# Patient Record
Sex: Female | Born: 1980 | Race: White | Hispanic: No | Marital: Married | Smoking: Former smoker
Health system: Southern US, Community
[De-identification: ages and names within clinical notes are randomized; demographics above are authoritative.]

---

## 2019-11-14 ENCOUNTER — Encounter: Payer: Self-pay | Admitting: Family Medicine

## 2019-11-14 ENCOUNTER — Ambulatory Visit (INDEPENDENT_AMBULATORY_CARE_PROVIDER_SITE_OTHER): Payer: Managed Care, Other (non HMO) | Admitting: Family Medicine

## 2019-11-14 ENCOUNTER — Other Ambulatory Visit: Payer: Self-pay

## 2019-11-14 VITALS — BP 115/62 | HR 60 | Temp 97.9°F | Ht 64.0 in | Wt 166.6 lb

## 2019-11-14 DIAGNOSIS — J4520 Mild intermittent asthma, uncomplicated: Secondary | ICD-10-CM

## 2019-11-14 DIAGNOSIS — J309 Allergic rhinitis, unspecified: Secondary | ICD-10-CM

## 2019-11-14 DIAGNOSIS — Z0001 Encounter for general adult medical examination with abnormal findings: Secondary | ICD-10-CM

## 2019-11-14 DIAGNOSIS — Z131 Encounter for screening for diabetes mellitus: Secondary | ICD-10-CM

## 2019-11-14 DIAGNOSIS — Z7189 Other specified counseling: Secondary | ICD-10-CM

## 2019-11-14 DIAGNOSIS — Z7185 Encounter for immunization safety counseling: Secondary | ICD-10-CM

## 2019-11-14 DIAGNOSIS — Z0000 Encounter for general adult medical examination without abnormal findings: Secondary | ICD-10-CM

## 2019-11-14 DIAGNOSIS — Z13220 Encounter for screening for lipoid disorders: Secondary | ICD-10-CM

## 2019-11-14 MED ORDER — ALBUTEROL SULFATE HFA 108 (90 BASE) MCG/ACT IN AERS: 1.0000 | INHALATION_SPRAY | RESPIRATORY_TRACT | 0 refills | Status: DC | PRN

## 2019-11-14 NOTE — Progress Notes (Signed)
Subjective:  Patient ID: Tammie Taylor, female    DOB: 03-27-1981  Age: 39 y.o. MRN: [id number]  CC:  Chief Complaint  Patient presents with  . Asthma    hx of asthma and requesting resuce inhaler PRN and possible CPE for work     HPI [employer] presents for  New patient to me to establish care, discuss medications as well as physical. Moved from [place] 3 yrs ago. No PCP locally, has been doing well.  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   Asthma: Worse as child - less as she as gotten older. Triggered by allergies - pet dander, horse has been trigger. Albuterol only about once per year.  Generic zyrtec daily works well. Nasal spray in past without much benefit.  Surgical history: c section - 6yo dtr, 24 yo son.   Nonsmoker, rare alcohol - less than 1-2 per week.  Family history: Mother with possible fibromyalgia.  No FH of CA, or heart disease.   Cancer screening: Pap testing - 2 years ago. Normal - at obgyn - [place].    There is no immunization history on file for this patient. covid vaccine: has not had wanted to wait until it has been around.  No flu shots typically. Father had Raynald Blend with swine flu shot - no personal history.    No exam data present   History There are no problems to display for this patient.  History reviewed. No pertinent past medical history.  Allergies  Allergen Reactions  . Sulfa Antibiotics Hives    Other reaction(s): Fever   Prior to Admission medications   Not on File   Social History   Socioeconomic History  . Marital status: Married    Spouse name: Not on file  . Number of children: Not on file  . Years of education: Not on file  . Highest education level: Not on file  Occupational History  . Not on file  Tobacco Use  . Smoking status: Former Smoker    Quit date: 11/14/2018    Years since quitting: 1.0  . Smokeless tobacco: Never Used  Substance and  Sexual Activity  . Alcohol use: Yes    Comment: socailly   . Drug use: Never  . Sexual activity: Yes    Partners: Male  Other Topics Concern  . Not on file  Social History Narrative  . Not on file   Social Determinants of Health   Financial Resource Strain:   . Difficulty of Paying Living Expenses:   Food Insecurity:   . Worried About Charity fundraiser in the Last Year:   . Arboriculturist in the Last Year:   Transportation Needs:   . Film/video editor (Medical):   Marland Kitchen Lack of Transportation (Non-Medical):   Physical Activity:   . Days of Exercise per Week:   . Minutes of Exercise per Session:   Stress:   . Feeling of Stress :   Social Connections:   . Frequency of Communication with Friends and Family:   . Frequency of Social Gatherings with Friends and Family:   . Attends Religious Services:   . Active Member of Clubs or Organizations:   . Attends Archivist Meetings:   Marland Kitchen Marital Status:   Intimate Partner Violence:   . Fear of Current or Ex-Partner:   . Emotionally Abused:   Marland Kitchen Physically Abused:   . Sexually Abused:  Review of Systems Per HPI.   Objective:   Vitals:   11/14/19 1107  BP: (!) 115/62  Pulse: 60  Temp: 97.9 F (36.6 C)  TempSrc: Temporal  SpO2: 98%  Weight: 166 lb 9.6 oz (75.6 kg)  Height: '5\' 4"'  (1.626 m)     Physical Exam Constitutional:      Appearance: She is well-developed.  HENT:     Head: Normocephalic and atraumatic.     Right Ear: External ear normal.     Left Ear: External ear normal.  Eyes:     Conjunctiva/sclera: Conjunctivae normal.     Pupils: Pupils are equal, round, and reactive to light.  Neck:     Thyroid: No thyromegaly.  Cardiovascular:     Rate and Rhythm: Normal rate and regular rhythm.     Heart sounds: Normal heart sounds. No murmur heard.   Pulmonary:     Effort: Pulmonary effort is normal. No respiratory distress.     Breath sounds: Normal breath sounds. No wheezing.  Abdominal:      General: Bowel sounds are normal.     Palpations: Abdomen is soft.     Tenderness: There is no abdominal tenderness.  Musculoskeletal:        General: Normal range of motion.     Cervical back: Normal range of motion and neck supple.  Lymphadenopathy:     Cervical: No cervical adenopathy.  Skin:    General: Skin is warm and dry.     Findings: No rash.  Neurological:     General: No focal deficit present.     Mental Status: She is alert and oriented to person, place, and time.  Psychiatric:        Behavior: Behavior normal.        Thought Content: Thought content normal.       Assessment & Plan:  Tammie Taylor is a 39 y.o. female . Annual physical exam  - -anticipatory guidance as below in AVS, screening labs at lab only visit.  Health maintenance items as above in HPI discussed/recommended as applicable.   - work form completed.   Mild intermittent asthma without complication - Plan: albuterol (VENTOLIN HFA) 108 (90 Base) MCG/ACT inhaler  - albuterol as needed. Well controlled.   Allergic rhinitis, unspecified seasonality, unspecified trigger  - stable with zyrtec.   Screening for diabetes mellitus - Plan: CMP14+EGFR, Hemoglobin A1c  Screening for hyperlipidemia - Plan: Lipid panel  Vaccine counseling  - recommended covid 19 vaccine. She is still deciding and may give a little more time until ready for that vaccine. Benefits of vaccination discussed, all questions were answered. Discussed increasing cases and timing until vaccinated with 2 part vaccines.   Meds ordered this encounter  Medications  . albuterol (VENTOLIN HFA) 108 (90 Base) MCG/ACT inhaler    Sig: Inhale 1-2 puffs into the lungs every 4 (four) hours as needed for wheezing or shortness of breath.    Dispense:  18 g    Refill:  0   Patient Instructions     Continue albuterol as needed.  Zyrtec daily. I would recommend flonase if you are going to be in a home with animals or possible triggers.    I do recommend covid vaccine. Let me know if you have questions.   Return for lab only visit at your convenience and I will complete your paperwork.   Keeping You Healthy  Get These Tests 1. Blood Pressure- Have your blood pressure checked once a  year by your health care provider.  Normal blood pressure is 120/80. 2. Weight- Have your body mass index (BMI) calculated to screen for obesity.  BMI is measure of body fat based on height and weight.  You can also calculate your own BMI at GravelBags.it. 3. Cholesterol- Have your cholesterol checked every 5 years starting at age 57 then yearly starting at age 58. 9. Chlamydia, HIV, and other sexually transmitted diseases- Get screened every year until age 40, then within three months of each new sexual provider. 5. Pap Test - Every 1-5 years; discuss with your health care provider. 6. Mammogram- Every 1-2 years starting at age 68--50  Take these medicines  Calcium with Vitamin D-Your body needs 1200 mg of Calcium each day and [phone] IU of Vitamin D daily.  Your body can only absorb 500 mg of Calcium at a time so Calcium must be taken in 2 or 3 divided doses throughout the day.  Multivitamin with folic acid- Once daily if it is possible for you to become pregnant.  Get these Immunizations  Gardasil-Series of three doses; prevents HPV related illness such as genital warts and cervical cancer.  Menactra-Single dose; prevents meningitis.  Tetanus shot- Every 10 years.  Flu shot-Every year.  Take these steps 1. Do not smoke-Your healthcare provider can help you quit.  For tips on how to quit go to www.smokefree.gov or call 1-800 QUITNOW. 2. Be physically active- Exercise 5 days a week for at least 30 minutes.  If you are not already physically active, start slow and gradually work up to 30 minutes of moderate physical activity.  Examples of moderate activity include walking briskly, dancing, swimming, bicycling, etc. 3. Breast  Cancer- A self breast exam every month is important for early detection of breast cancer.  For more information and instruction on self breast exams, ask your healthcare provider or https://www.patel.info/. 4. Eat a healthy diet- Eat a variety of healthy foods such as fruits, vegetables, whole grains, low fat milk, low fat cheeses, yogurt, lean meats, poultry and fish, beans, nuts, tofu, etc.  For more information go to www. Thenutritionsource.org 5. Drink alcohol in moderation- Limit alcohol intake to one drink or less per day. Never drink and drive. 6. Depression- Your emotional health is as important as your physical health.  If you're feeling down or losing interest in things you normally enjoy please talk to your healthcare provider about being screened for depression. 7. Dental visit- Brush and floss your teeth twice daily; visit your dentist twice a year. 8. Eye doctor- Get an eye exam at least every 2 years. 9. Helmet use- Always wear a helmet when riding a bicycle, motorcycle, rollerblading or skateboarding. 19. Safe sex- If you may be exposed to sexually transmitted infections, use a condom. 11. Seat belts- Seat belts can save your live; always wear one. 12. Smoke/Carbon Monoxide detectors- These detectors need to be installed on the appropriate level of your home. Replace batteries at least once a year. 13. Skin cancer- When out in the sun please cover up and use sunscreen 15 SPF or higher. 14. Violence- If anyone is threatening or hurting you, please tell your healthcare provider.         If you have lab work done today you will be contacted with your lab results within the next 2 weeks.  If you have not heard from [place] then please contact us. The fastest way to get your results is to register for My Chart.  IF you received an x-ray today, you will receive an invoice from [facility]. Please contact [facility] at [phone] with questions  or concerns regarding your invoice.   IF you received labwork today, you will receive an invoice from [facility]. Please contact [facility] at [phone] with questions or concerns regarding your invoice.   Our billing staff will not be able to assist you with questions regarding bills from these companies.  You will be contacted with the lab results as soon as they are available. The fastest way to get your results is to activate your My Chart account. Instructions are located on the last page of this paperwork. If you have not heard from [place] regarding the results in 2 weeks, please contact this office.          Signed, Merri Ray, MD [facility]

## 2019-11-14 NOTE — Patient Instructions (Addendum)
Continue albuterol as needed.  Zyrtec daily. I would recommend flonase if you are going to be in a home with animals or possible triggers.   I do recommend covid vaccine. Let me know if you have questions.   Return for lab only visit at your convenience and I will complete your paperwork.   Keeping You Healthy  Get These Tests 1. Blood Pressure- Have your blood pressure checked once a year by your health care provider.  Normal blood pressure is 120/80. 2. Weight- Have your body mass index (BMI) calculated to screen for obesity.  BMI is measure of body fat based on height and weight.  You can also calculate your own BMI at https://www.west-esparza.com/. 3. Cholesterol- Have your cholesterol checked every 5 years starting at age 2 then yearly starting at age 75. 4. Chlamydia, HIV, and other sexually transmitted diseases- Get screened every year until age 55, then within three months of each new sexual provider. 5. Pap Test - Every 1-5 years; discuss with your health care provider. 6. Mammogram- Every 1-2 years starting at age 75--50  Take these medicines  Calcium with Vitamin D-Your body needs 1200 mg of Calcium each day and [phone] IU of Vitamin D daily.  Your body can only absorb 500 mg of Calcium at a time so Calcium must be taken in 2 or 3 divided doses throughout the day.  Multivitamin with folic acid- Once daily if it is possible for you to become pregnant.  Get these Immunizations  Gardasil-Series of three doses; prevents HPV related illness such as genital warts and cervical cancer.  Menactra-Single dose; prevents meningitis.  Tetanus shot- Every 10 years.  Flu shot-Every year.  Take these steps 1. Do not smoke-Your healthcare provider can help you quit.  For tips on how to quit go to www.smokefree.gov or call 1-800 QUITNOW. 2. Be physically active- Exercise 5 days a week for at least 30 minutes.  If you are not already physically active, start slow and gradually work up to 30  minutes of moderate physical activity.  Examples of moderate activity include walking briskly, dancing, swimming, bicycling, etc. 3. Breast Cancer- A self breast exam every month is important for early detection of breast cancer.  For more information and instruction on self breast exams, ask your healthcare provider or [employer].es. 4. Eat a healthy diet- Eat a variety of healthy foods such as fruits, vegetables, whole grains, low fat milk, low fat cheeses, yogurt, lean meats, poultry and fish, beans, nuts, tofu, etc.  For more information go to www. Thenutritionsource.org 5. Drink alcohol in moderation- Limit alcohol intake to one drink or less per day. Never drink and drive. 6. Depression- Your emotional health is as important as your physical health.  If you're feeling down or losing interest in things you normally enjoy please talk to your healthcare provider about being screened for depression. 7. Dental visit- Brush and floss your teeth twice daily; visit your dentist twice a year. 8. Eye doctor- Get an eye exam at least every 2 years. 9. Helmet use- Always wear a helmet when riding a bicycle, motorcycle, rollerblading or skateboarding. 10. Safe sex- If you may be exposed to sexually transmitted infections, use a condom. 11. Seat belts- Seat belts can save your live; always wear one. 12. Smoke/Carbon Monoxide detectors- These detectors need to be installed on the appropriate level of your home. Replace batteries at least once a year. 13. Skin cancer- When out in the sun please cover up and use  sunscreen 15 SPF or higher. 14. Violence- If anyone is threatening or hurting you, please tell your healthcare provider.         If you have lab work done today you will be contacted with your lab results within the next 2 weeks.  If you have not heard from [place] then please contact us. The fastest way to get your results is to register for My Chart.   IF you  received an x-ray today, you will receive an invoice from [facility]. Please contact [facility] at [phone] with questions or concerns regarding your invoice.   IF you received labwork today, you will receive an invoice from [place]. Please contact [facility] at [phone] with questions or concerns regarding your invoice.   Our billing staff will not be able to assist you with questions regarding bills from these companies.  You will be contacted with the lab results as soon as they are available. The fastest way to get your results is to activate your My Chart account. Instructions are located on the last page of this paperwork. If you have not heard from [place] regarding the results in 2 weeks, please contact this office.

## 2019-11-15 ENCOUNTER — Encounter: Payer: Self-pay | Admitting: Family Medicine

## 2019-11-20 ENCOUNTER — Ambulatory Visit: Payer: Managed Care, Other (non HMO)

## 2019-11-20 ENCOUNTER — Ambulatory Visit (INDEPENDENT_AMBULATORY_CARE_PROVIDER_SITE_OTHER): Payer: Managed Care, Other (non HMO) | Admitting: Family Medicine

## 2019-11-20 DIAGNOSIS — Z131 Encounter for screening for diabetes mellitus: Secondary | ICD-10-CM

## 2019-11-20 DIAGNOSIS — Z13220 Encounter for screening for lipoid disorders: Secondary | ICD-10-CM

## 2019-11-21 LAB — CMP14+EGFR
ALT: 12 IU/L (ref 0–32)
AST: 13 IU/L (ref 0–40)
Albumin/Globulin Ratio: 1.9 (ref 1.2–2.2)
Albumin: 4.5 g/dL (ref 3.8–4.8)
Alkaline Phosphatase: 72 IU/L (ref 48–121)
BUN/Creatinine Ratio: 20 (ref 9–23)
BUN: 19 mg/dL (ref 6–20)
Bilirubin Total: 0.5 mg/dL (ref 0.0–1.2)
CO2: 25 mmol/L (ref 20–29)
Calcium: 9.5 mg/dL (ref 8.7–10.2)
Chloride: 103 mmol/L (ref 96–106)
Creatinine, Ser: 0.96 mg/dL (ref 0.57–1.00)
GFR calc Af Amer: 86 mL/min/{1.73_m2} (ref >59)
GFR calc non Af Amer: 75 mL/min/{1.73_m2} (ref >59)
Globulin, Total: 2.4 g/dL (ref 1.5–4.5)
Glucose: 92 mg/dL (ref 65–99)
Potassium: 4.5 mmol/L (ref 3.5–5.2)
Sodium: 140 mmol/L (ref 134–144)
Total Protein: 6.9 g/dL (ref 6.0–8.5)

## 2019-11-21 LAB — LIPID PANEL
Chol/HDL Ratio: 3.2 ratio (ref 0.0–4.4)
Cholesterol, Total: 184 mg/dL (ref 100–199)
HDL: 58 mg/dL (ref >39)
LDL Chol Calc (NIH): 107 mg/dL — ABNORMAL HIGH (ref 0–99)
Triglycerides: 109 mg/dL (ref 0–149)
VLDL Cholesterol Cal: 19 mg/dL (ref 5–40)

## 2019-11-21 LAB — HEMOGLOBIN A1C
Est. average glucose Bld gHb Est-mCnc: 111 mg/dL
Hgb A1c MFr Bld: 5.5 % (ref 4.8–5.6)

## 2019-11-21 NOTE — Progress Notes (Signed)
Nurse visit

## 2019-12-18 ENCOUNTER — Ambulatory Visit: Payer: Managed Care, Other (non HMO)

## 2021-03-15 ENCOUNTER — Other Ambulatory Visit: Payer: Self-pay | Admitting: Obstetrics & Gynecology

## 2021-03-15 DIAGNOSIS — R928 Other abnormal and inconclusive findings on diagnostic imaging of breast: Secondary | ICD-10-CM

## 2021-03-16 ENCOUNTER — Ambulatory Visit
Admission: RE | Admit: 2021-03-16 | Discharge: 2021-03-16 | Disposition: A | Discharge status: Home / Self Care | Payer: BC Managed Care – PPO | Source: Ambulatory Visit | Attending: Obstetrics & Gynecology | Admitting: Obstetrics & Gynecology

## 2021-03-16 ENCOUNTER — Ambulatory Visit
Admission: RE | Admit: 2021-03-16 | Discharge: 2021-03-16 | Disposition: A | Discharge status: Home / Self Care | Payer: Managed Care, Other (non HMO) | Source: Ambulatory Visit | Attending: Obstetrics & Gynecology | Admitting: Obstetrics & Gynecology

## 2021-03-16 ENCOUNTER — Other Ambulatory Visit: Payer: Self-pay | Admitting: Obstetrics & Gynecology

## 2021-03-16 ENCOUNTER — Other Ambulatory Visit: Payer: Self-pay

## 2021-03-16 DIAGNOSIS — R928 Other abnormal and inconclusive findings on diagnostic imaging of breast: Secondary | ICD-10-CM

## 2021-09-14 ENCOUNTER — Other Ambulatory Visit: Payer: Self-pay | Admitting: Obstetrics & Gynecology

## 2021-09-14 ENCOUNTER — Ambulatory Visit
Admission: RE | Admit: 2021-09-14 | Discharge: 2021-09-14 | Disposition: A | Discharge status: Home / Self Care | Payer: BC Managed Care – PPO | Source: Ambulatory Visit | Attending: Obstetrics & Gynecology | Admitting: Obstetrics & Gynecology

## 2021-09-14 ENCOUNTER — Ambulatory Visit: Admission: RE | Admit: 2021-09-14 | Payer: BC Managed Care – PPO | Source: Ambulatory Visit

## 2021-09-14 DIAGNOSIS — R928 Other abnormal and inconclusive findings on diagnostic imaging of breast: Secondary | ICD-10-CM

## 2021-09-15 ENCOUNTER — Other Ambulatory Visit: Payer: BC Managed Care – PPO

## 2022-03-01 ENCOUNTER — Ambulatory Visit
Admission: RE | Admit: 2022-03-01 | Discharge: 2022-03-01 | Disposition: A | Discharge status: Home / Self Care | Payer: BC Managed Care – PPO | Source: Ambulatory Visit | Attending: Obstetrics & Gynecology | Admitting: Obstetrics & Gynecology

## 2022-03-01 DIAGNOSIS — R928 Other abnormal and inconclusive findings on diagnostic imaging of breast: Secondary | ICD-10-CM

## 2023-01-30 ENCOUNTER — Other Ambulatory Visit: Payer: Self-pay | Admitting: Obstetrics & Gynecology

## 2023-01-30 DIAGNOSIS — Z1231 Encounter for screening mammogram for malignant neoplasm of breast: Secondary | ICD-10-CM

## 2023-02-27 ENCOUNTER — Other Ambulatory Visit: Payer: Self-pay | Admitting: Obstetrics & Gynecology

## 2023-02-27 DIAGNOSIS — N6489 Other specified disorders of breast: Secondary | ICD-10-CM

## 2023-03-05 ENCOUNTER — Ambulatory Visit: Payer: BC Managed Care – PPO

## 2023-03-07 ENCOUNTER — Other Ambulatory Visit: Payer: Self-pay

## 2023-03-07 DIAGNOSIS — N6489 Other specified disorders of breast: Secondary | ICD-10-CM

## 2023-03-22 ENCOUNTER — Ambulatory Visit: Payer: Self-pay | Admitting: Hematology and Oncology

## 2023-03-22 VITALS — BP 122/78 | Wt 173.0 lb

## 2023-03-22 DIAGNOSIS — N6489 Other specified disorders of breast: Secondary | ICD-10-CM

## 2023-03-22 NOTE — Progress Notes (Signed)
[name] is a 42 y.o. female who presents to [facility] clinic today with no complaints. Follow up breast asymmetry.   Pap Smear: Pap not smear completed today. Patient states she is "keeping up with Pap smears". Declines Pap smear.   Physical exam: Breasts Breasts symmetrical. No skin abnormalities bilateral breasts. No nipple retraction bilateral breasts. No nipple discharge bilateral breasts. No lymphadenopathy. No lumps palpated bilateral breasts.  MM DIAG BREAST TOMO BILATERAL  Result Date: 03/01/2022 CLINICAL DATA:  One year follow-up of a left breast asymmetry. Annual mammography. EXAM: DIGITAL DIAGNOSTIC BILATERAL MAMMOGRAM WITH TOMOSYNTHESIS TECHNIQUE: Bilateral digital diagnostic mammography and breast tomosynthesis was performed. COMPARISON:  Previous exam(s). ACR Breast Density Category a: The breast tissue is almost entirely fatty. FINDINGS: The left breast asymmetry is stable. No new or suspicious findings in either breast. IMPRESSION: Stable probably benign left breast asymmetry. No evidence of malignancy otherwise identified in either breast. RECOMMENDATION: Recommend 12 month follow-up diagnostic mammography to ensure further stability of the left breast asymmetry. I have discussed the findings and recommendations with the patient. If applicable, a reminder letter will be sent to the patient regarding the next appointment. BI-RADS CATEGORY  3: Probably benign. Electronically Signed   By: Gerome Sam III M.D.   On: 03/01/2022 08:28  MM DIAG BREAST TOMO UNI LEFT  Result Date: 09/14/2021 CLINICAL DATA:  Short-term follow-up for probably benign left breast asymmetry. EXAM: DIGITAL DIAGNOSTIC UNILATERAL LEFT MAMMOGRAM WITH TOMOSYNTHESIS AND CAD TECHNIQUE: Left digital diagnostic mammography and breast tomosynthesis was performed. The images were evaluated with computer-aided detection. COMPARISON:  Previous exam(s). ACR Breast Density Category b: There are scattered areas of  fibroglandular density. FINDINGS: The small asymmetry in the 6 o'clock retroareolar left breast is unchanged. There are no masses, new areas of asymmetry, areas of architectural distortion or suspicious calcifications. No mammographic change. IMPRESSION: Probably benign area asymmetry in the left breast, stable for 6 months. Additional short-term follow-up recommended. RECOMMENDATION: Diagnostic bilateral mammography in 6 months. I have discussed the findings and recommendations with the patient. If applicable, a reminder letter will be sent to the patient regarding the next appointment. BI-RADS CATEGORY  3: Probably benign. Electronically Signed   By: Amie Portland M.D.   On: 09/14/2021 08:05  MM DIAG BREAST TOMO UNI LEFT  Result Date: 03/16/2021 CLINICAL DATA:  Possible mass in the 6 o'clock retroareolar left breast on a recent screening mammogram. EXAM: DIGITAL DIAGNOSTIC UNILATERAL LEFT MAMMOGRAM WITH TOMOSYNTHESIS AND CAD; ULTRASOUND LEFT BREAST LIMITED TECHNIQUE: Left digital diagnostic mammography and breast tomosynthesis was performed. The images were evaluated with computer-aided detection.; Targeted ultrasound examination of the left breast was performed. COMPARISON:  Previous exam(s). ACR Breast Density Category b: There are scattered areas of fibroglandular density. FINDINGS: 3D tomographic and 2D generated spot compression images of the left breast demonstrate a persistent 6 mm oval density in the 6 o'clock retroareolar region of the breast. On 1 of the spot compression craniocaudal set of images, this is circumscribed. On the others, this has more indistinct margins. On physical exam, no mass is palpable in the retroareolar left breast. Targeted ultrasound is performed, showing normal appearing fatty tissue and small amount of normal appearing fibroglandular tissue in the retroareolar left breast in the area of mammographic concern. No cystic or solid masses were seen. IMPRESSION: Small, probably  benign asymmetry in the 6 o'clock retroareolar left breast, most likely corresponding to normal fibroglandular tissue seen at ultrasound. RECOMMENDATION: Left 3D diagnostic mammogram and possible ultrasound 6  months. I have discussed the findings and recommendations with the patient. If applicable, a reminder letter will be sent to the patient regarding the next appointment. BI-RADS CATEGORY  3: Probably benign. Electronically Signed   By: Beckie Salts M.D.   On: 03/16/2021 14:38        Pelvic/Bimanual Pap is not indicated today    Smoking History: Patient has is a former smoker and was not referred to quit line.    Patient Navigation: Patient education provided. Access to services provided for patient through [facility] program. No interpreter provided. No transportation provided   Colorectal Cancer Screening: Per patient has never had colonoscopy completed No complaints today.    Breast and Cervical Cancer Risk Assessment: Patient has family history of breast cancer, with her aunt. Patient does not have history of cervical dysplasia, immunocompromised, or DES exposure in-utero.  Risk Scores as of Encounter on 03/22/2023     Tammie Taylor           5-year 0.91%   Lifetime 13.36%            Last calculated by Tammie Taylor, CMA on 03/22/2023 at  2:21 PM          A: [facility] exam without pap smear No complaints with benign exam. Follow up breast asymmetry.   P: Referred patient to the [facility] for a diagnostic mammogram. Appointment scheduled 03/22/2023.  Tammie Lux, NP 03/22/2023 2:13 PM

## 2023-03-22 NOTE — Patient Instructions (Addendum)
Taught Tammie Taylor about self breast awareness and gave educational materials to take home. Patient declines Pap smear.  Referred patient to the [facility] for diagnostic mammogram. Appointment scheduled for 03/22/2023. Patient aware of appointment and will be there. Let patient know will follow up with her within the next couple weeks with results. Griffith Citron Lynk verbalized understanding.  Pascal Lux, NP 2:15 PM

## 2023-03-27 ENCOUNTER — Ambulatory Visit
Admission: RE | Admit: 2023-03-27 | Discharge: 2023-03-27 | Disposition: A | Discharge status: Home / Self Care | Payer: No Typology Code available for payment source | Source: Ambulatory Visit | Attending: Obstetrics and Gynecology | Admitting: Obstetrics and Gynecology

## 2023-03-27 DIAGNOSIS — N6489 Other specified disorders of breast: Secondary | ICD-10-CM

## 2023-10-23 IMAGING — MG MM DIGITAL DIAGNOSTIC UNILAT*L* W/ TOMO W/ CAD
6 series · 6 of 18 positions shown · non-contrast
Comparison: Previous exam(s).

CLINICAL DATA: Short-term follow-up for probably benign left breast
asymmetry.

EXAM:
DIGITAL DIAGNOSTIC UNILATERAL LEFT MAMMOGRAM WITH TOMOSYNTHESIS AND
CAD
TECHNIQUE: Left digital diagnostic mammography and breast tomosynthesis was
performed. The images were evaluated with computer-aided detection.

[L MLO synth-2D (1 of 2)]
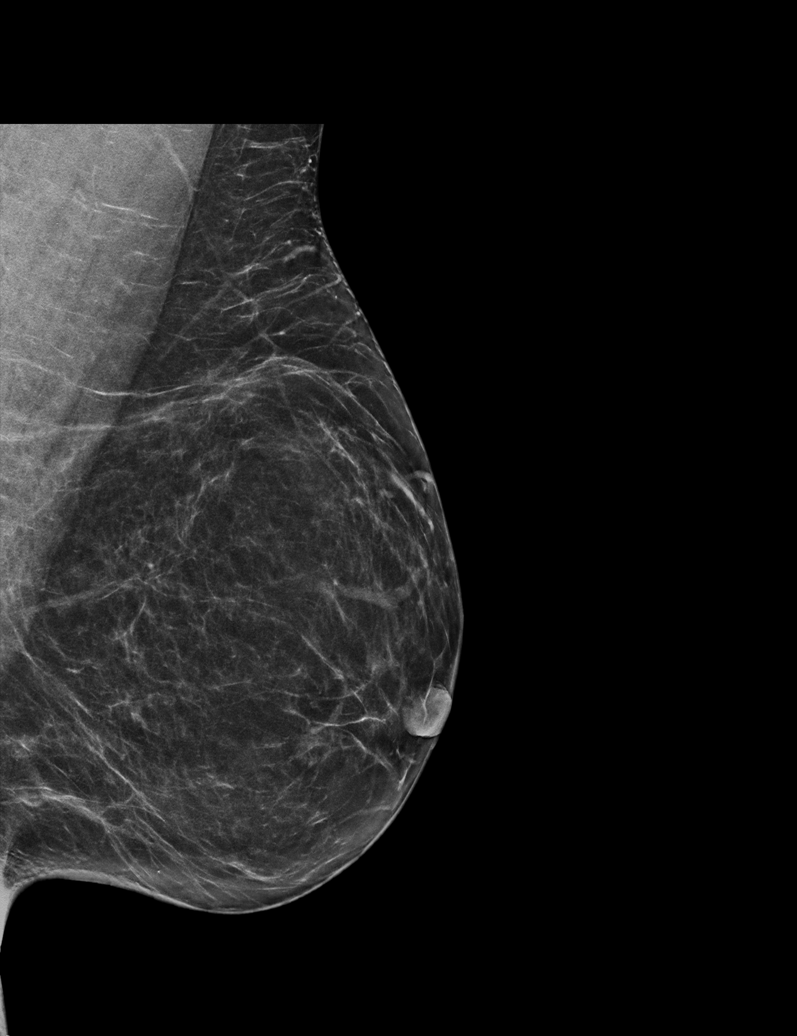

[L MLO synth-2D (2 of 2)]
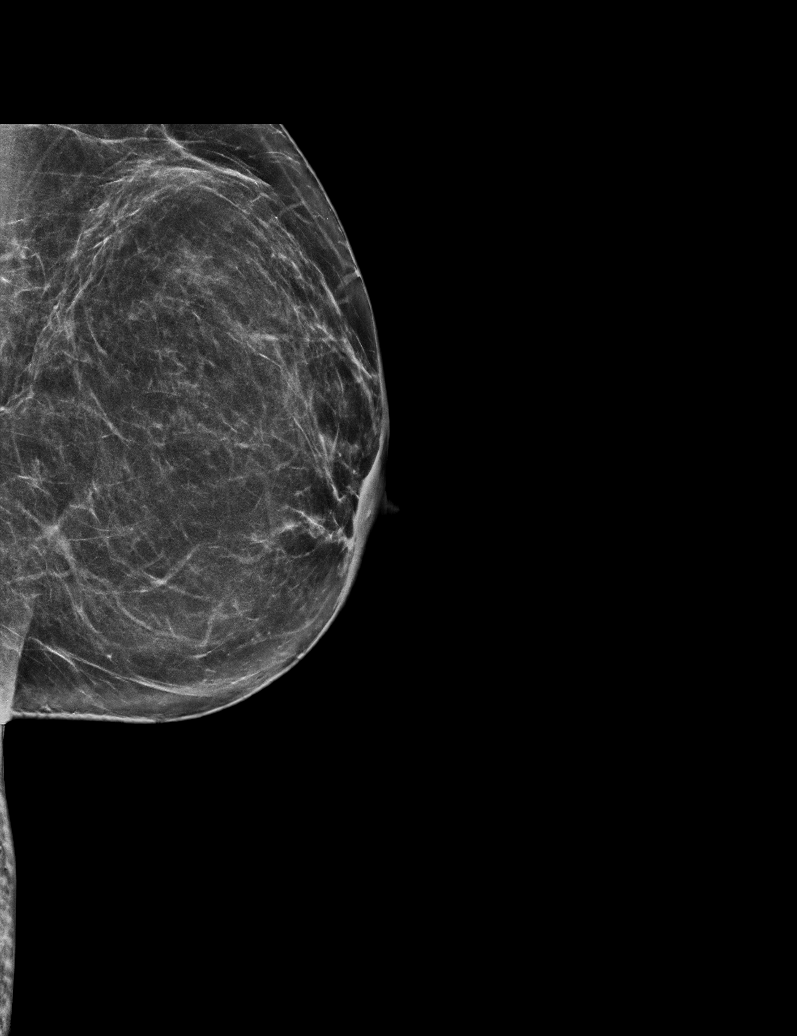

[L CC synth-2D]
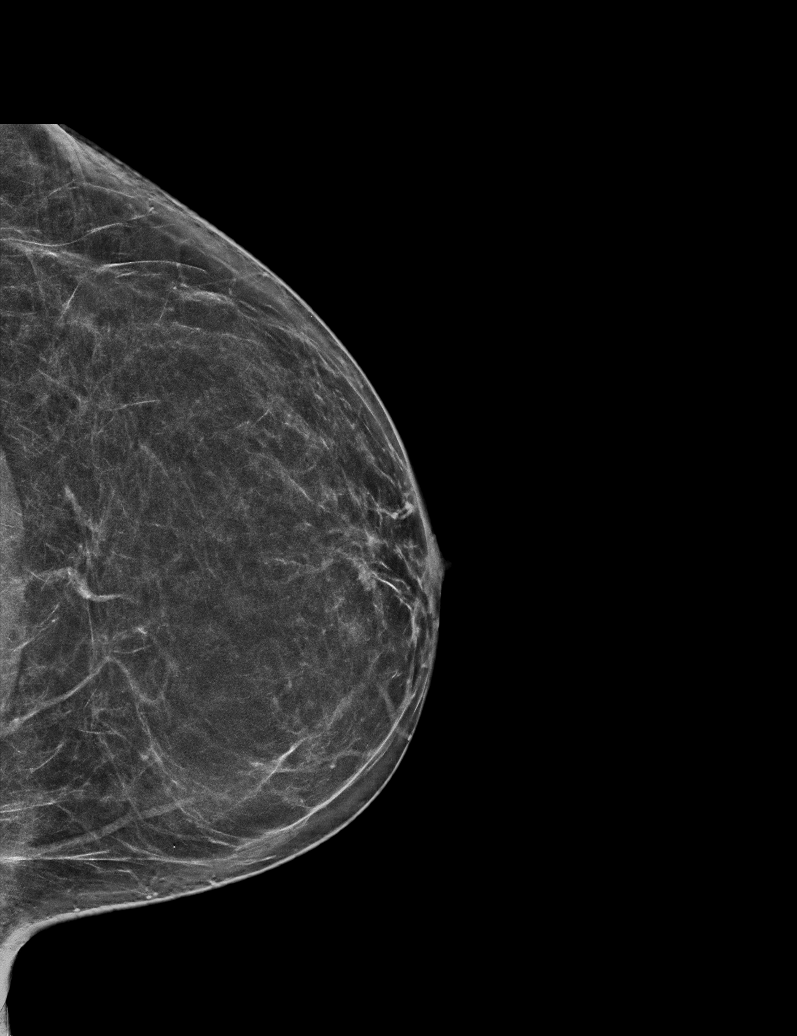

[L CC tomo · tomo slice 31/60.0]
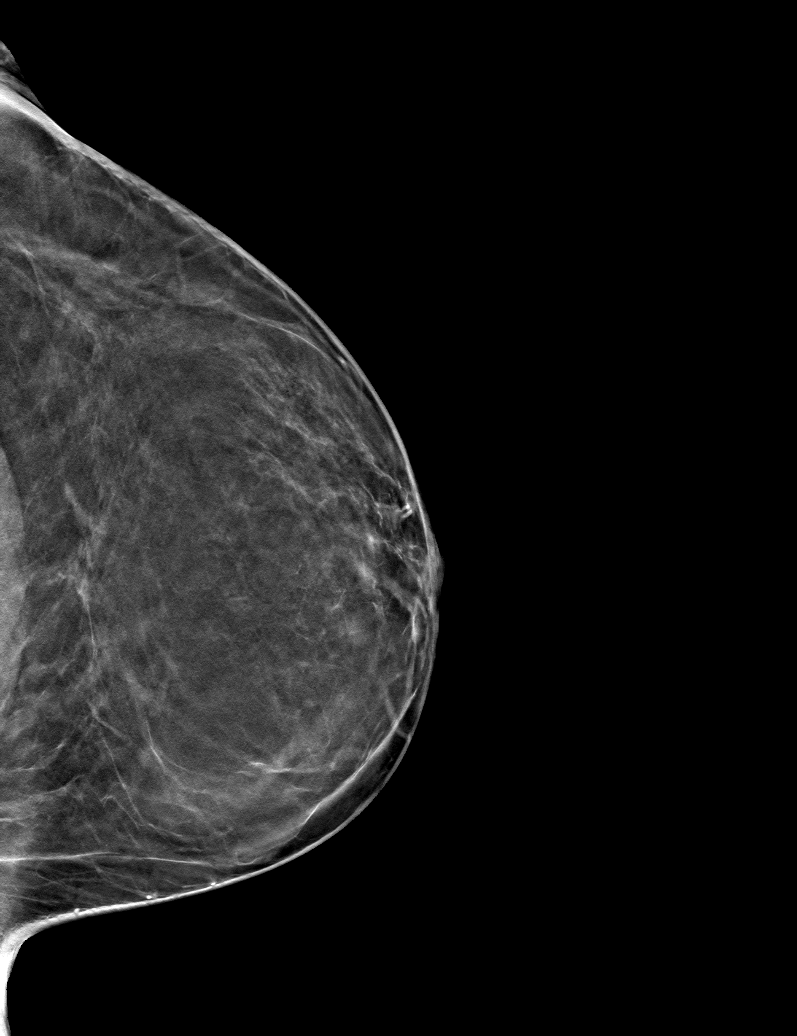

[L MLO tomo (1 of 2) · tomo slice 32/63.0]
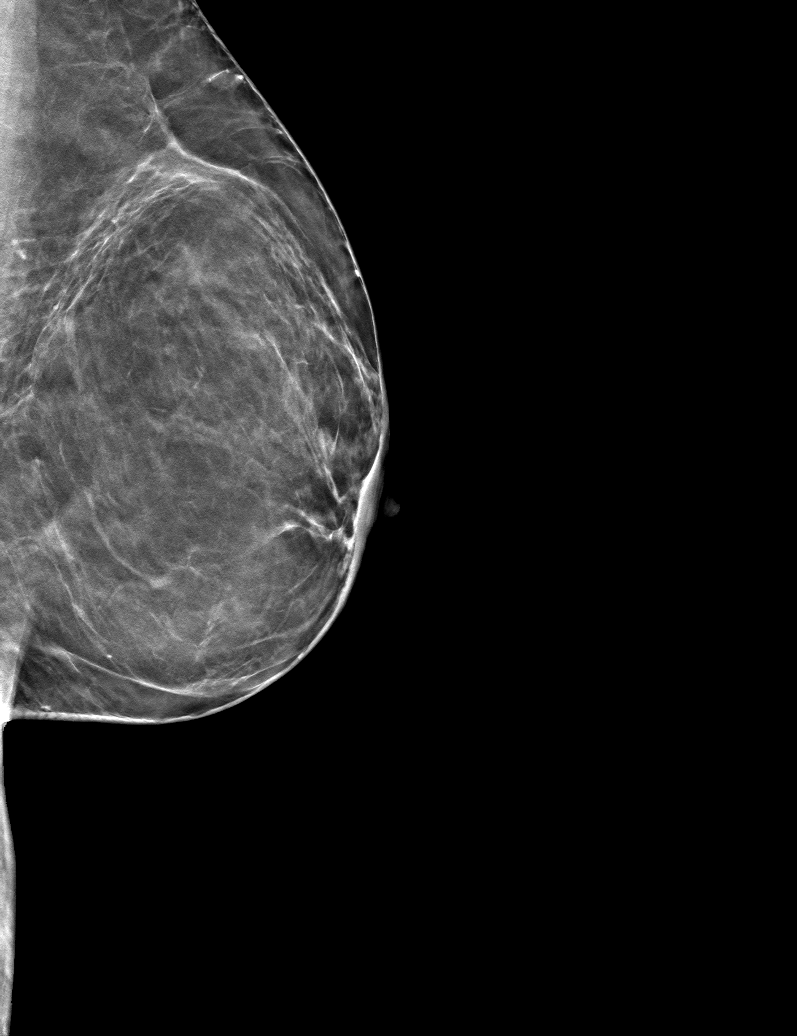

[L MLO tomo (2 of 2) · tomo slice 30/59.0]
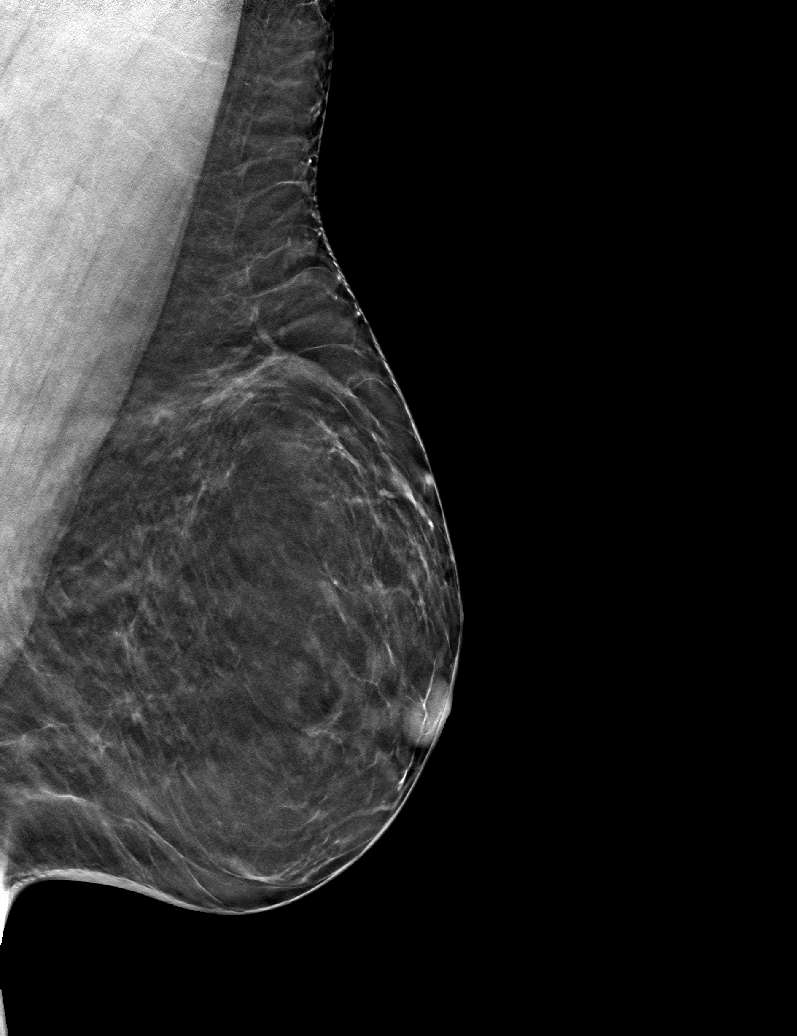

[6 of 18 positions shown; findings below may reference images not displayed]

ACR Breast Density Category b: There are scattered areas of
fibroglandular density.
FINDINGS: The small asymmetry in the 6 o'clock retroareolar left breast is
unchanged.

There are no masses, new areas of asymmetry, areas of architectural
distortion or suspicious calcifications. No mammographic change.
IMPRESSION: Probably benign area asymmetry in the left breast, stable for 6
months. Additional short-term follow-up recommended.

RECOMMENDATION:
Diagnostic bilateral mammography in 6 months.

I have discussed the findings and recommendations with the patient.
If applicable, a reminder letter will be sent to the patient
regarding the next appointment.

BI-RADS CATEGORY  3: Probably benign.

## 2024-03-11 ENCOUNTER — Encounter: Payer: Self-pay | Admitting: Family Medicine

## 2024-03-11 ENCOUNTER — Other Ambulatory Visit: Payer: Self-pay | Admitting: Obstetrics and Gynecology

## 2024-03-11 DIAGNOSIS — Z1231 Encounter for screening mammogram for malignant neoplasm of breast: Secondary | ICD-10-CM

## 2024-05-15 ENCOUNTER — Ambulatory Visit

## 2024-05-15 ENCOUNTER — Ambulatory Visit
Admission: RE | Admit: 2024-05-15 | Discharge: 2024-05-15 | Disposition: A | Discharge status: Home / Self Care | Source: Ambulatory Visit | Attending: Obstetrics and Gynecology | Admitting: Obstetrics and Gynecology

## 2024-05-15 ENCOUNTER — Ambulatory Visit: Payer: Self-pay | Admitting: *Deleted

## 2024-05-15 VITALS — BP 136/80 | Ht 64.0 in | Wt 184.0 lb

## 2024-05-15 DIAGNOSIS — Z1239 Encounter for other screening for malignant neoplasm of breast: Secondary | ICD-10-CM

## 2024-05-15 DIAGNOSIS — Z1231 Encounter for screening mammogram for malignant neoplasm of breast: Secondary | ICD-10-CM

## 2024-05-15 NOTE — Progress Notes (Signed)
 [name] is a 44 y.o. female who presents to [facility] clinic today with complaint of left outer sharp breast pains x 3 years that comes and goes x 3 years. Patient had a diagnostic mammogram for follow up that was followed with last one 03/27/2023 that was benign.    Pap Smear: Pap smear not completed today. Last Pap smear was 3 years ago at [facility] and was normal per patient. Per patient has history of an abnormal Pap smear over 8 years ago that had a repeat Pap smear for follow up that was normal. Last Pap smear result is not available in Epic.   Physical exam: Breasts Breasts symmetrical. No skin abnormalities bilateral breasts. No nipple retraction bilateral breasts. No nipple discharge bilateral breasts. No lymphadenopathy. No lumps palpated bilateral breasts. No complaints of pain or tenderness on exam.    MS 3D DIAG MAMMO BILAT BR (aka MM) Result Date: 03/27/2023 CLINICAL DATA:  44 year old female presents for 2 year follow-up of LEFT breast asymmetry (previously without sonographic correlate) and for annual bilateral mammogram. EXAM: DIGITAL DIAGNOSTIC BILATERAL MAMMOGRAM WITH TOMOSYNTHESIS AND CAD TECHNIQUE: Bilateral digital diagnostic mammography and breast tomosynthesis was performed. The images were evaluated with computer-aided detection. COMPARISON:  Previous exam(s). ACR Breast Density Category a: The breasts are almost entirely fatty. FINDINGS: Full field views of both breasts demonstrate an unchanged RETROAREOLAR LEFT breast asymmetry. No new or suspicious mammographic findings within either breast identified. IMPRESSION: 1. Unchanged RETROAREOLAR LEFT breast asymmetry, considered benign given 2 year stability. 2. No new or suspicious mammographic findings within either breast. RECOMMENDATION: Bilateral screening mammogram in 1 year. I have discussed the findings and recommendations with the patient. If applicable, a reminder letter will be sent to the patient  regarding the next appointment. BI-RADS CATEGORY  2: Benign. Electronically Signed   By: Reyes Phi M.D.   On: 03/27/2023 16:22   MM DIAG BREAST TOMO BILATERAL Result Date: 03/01/2022 CLINICAL DATA:  One year follow-up of a left breast asymmetry. Annual mammography. EXAM: DIGITAL DIAGNOSTIC BILATERAL MAMMOGRAM WITH TOMOSYNTHESIS TECHNIQUE: Bilateral digital diagnostic mammography and breast tomosynthesis was performed. COMPARISON:  Previous exam(s). ACR Breast Density Category a: The breast tissue is almost entirely fatty. FINDINGS: The left breast asymmetry is stable. No new or suspicious findings in either breast. IMPRESSION: Stable probably benign left breast asymmetry. No evidence of malignancy otherwise identified in either breast. RECOMMENDATION: Recommend 12 month follow-up diagnostic mammography to ensure further stability of the left breast asymmetry. I have discussed the findings and recommendations with the patient. If applicable, a reminder letter will be sent to the patient regarding the next appointment. BI-RADS CATEGORY  3: Probably benign. Electronically Signed   By: Alm Pouch III M.D.   On: 03/01/2022 08:28  MM DIAG BREAST TOMO UNI LEFT Result Date: 09/14/2021 CLINICAL DATA:  Short-term follow-up for probably benign left breast asymmetry. EXAM: DIGITAL DIAGNOSTIC UNILATERAL LEFT MAMMOGRAM WITH TOMOSYNTHESIS AND CAD TECHNIQUE: Left digital diagnostic mammography and breast tomosynthesis was performed. The images were evaluated with computer-aided detection. COMPARISON:  Previous exam(s). ACR Breast Density Category b: There are scattered areas of fibroglandular density. FINDINGS: The small asymmetry in the 6 o'clock retroareolar left breast is unchanged. There are no masses, new areas of asymmetry, areas of architectural distortion or suspicious calcifications. No mammographic change. IMPRESSION: Probably benign area asymmetry in the left breast, stable for 6 months. Additional  short-term follow-up recommended. RECOMMENDATION: Diagnostic bilateral mammography in 6 months. I have discussed the findings and  recommendations with the patient. If applicable, a reminder letter will be sent to the patient regarding the next appointment. BI-RADS CATEGORY  3: Probably benign. Electronically Signed   By: Alm Parkins M.D.   On: 09/14/2021 08:05  MM DIAG BREAST TOMO UNI LEFT Result Date: 03/16/2021 CLINICAL DATA:  Possible mass in the 6 o'clock retroareolar left breast on a recent screening mammogram. EXAM: DIGITAL DIAGNOSTIC UNILATERAL LEFT MAMMOGRAM WITH TOMOSYNTHESIS AND CAD; ULTRASOUND LEFT BREAST LIMITED TECHNIQUE: Left digital diagnostic mammography and breast tomosynthesis was performed. The images were evaluated with computer-aided detection.; Targeted ultrasound examination of the left breast was performed. COMPARISON:  Previous exam(s). ACR Breast Density Category b: There are scattered areas of fibroglandular density. FINDINGS: 3D tomographic and 2D generated spot compression images of the left breast demonstrate a persistent 6 mm oval density in the 6 o'clock retroareolar region of the breast. On 1 of the spot compression craniocaudal set of images, this is circumscribed. On the others, this has more indistinct margins. On physical exam, no mass is palpable in the retroareolar left breast. Targeted ultrasound is performed, showing normal appearing fatty tissue and small amount of normal appearing fibroglandular tissue in the retroareolar left breast in the area of mammographic concern. No cystic or solid masses were seen. IMPRESSION: Small, probably benign asymmetry in the 6 o'clock retroareolar left breast, most likely corresponding to normal fibroglandular tissue seen at ultrasound. RECOMMENDATION: Left 3D diagnostic mammogram and possible ultrasound 6 months. I have discussed the findings and recommendations with the patient. If applicable, a reminder letter will be sent to the  patient regarding the next appointment. BI-RADS CATEGORY  3: Probably benign. Electronically Signed   By: Elspeth Bathe M.D.   On: 03/16/2021 14:38   Pelvic/Bimanual Pap is indicated today. Patient refused Pap smear today and stated she will have completed at her PCP. Explained the importance of Pap smears.  Smoking History: Patient is a former smoker that quit 25 years ago.   Patient Navigation: Patient education provided. Access to services provided for patient through [facility] program.   Breast and Cervical Cancer Risk Assessment: Patient has family history of a maternal aunt having breast cancer. Patient has no known genetic mutations or history of radiation treatment to the chest before age 31. Patient does not have history of cervical dysplasia, immunocompromised, or DES exposure in-utero.  Risk Scores as of Encounter on 05/15/2024     Alisa           5-year 0.99%   Lifetime 13.24%            Last calculated by Silas, Ansyi K, CMA on 05/15/2024 at  1:47 PM        A: No complaints.  P: Referred patient to the [facility] for a screening mammogram. Appointment scheduled Thursday, May 15, 2024 at 1400.  Driscilla Wanda SQUIBB, RN 05/15/2024 1:53 PM

## 2024-05-15 NOTE — Patient Instructions (Signed)
 Explained breast self awareness with Shalita V Gauna. Pap is indicated today. Patient refused Pap smear today and stated she will have completed at her PCP. Explained the importance of Pap smears. Let her know [facility] will cover Pap smears every 3 years unless has a history of abnormal Pap smears. Referred patient to the [facility] for a screening mammogram. Appointment scheduled Thursday, May 15, 2024 at 1400. Patient aware of appointment and will be there. Let patient know the Breast Center will follow up with her within the next couple weeks with results of her mammogram by letter or phone. Laneta GAILS Pottinger verbalized understanding.  Lorcan Shelp, Wanda Ship, RN 1:52 PM
